# Patient Record
Sex: Male | Born: 1980 | Hispanic: Yes | Marital: Single | Smoking: Never smoker
Health system: Southern US, Community
[De-identification: ages and names within clinical notes are randomized; demographics above are authoritative.]

## PROBLEM LIST (undated history)

## (undated) DIAGNOSIS — F329 Major depressive disorder, single episode, unspecified: Secondary | ICD-10-CM

## (undated) DIAGNOSIS — F32A Depression, unspecified: Secondary | ICD-10-CM

## (undated) DIAGNOSIS — F909 Attention-deficit hyperactivity disorder, unspecified type: Secondary | ICD-10-CM

## (undated) DIAGNOSIS — E119 Type 2 diabetes mellitus without complications: Secondary | ICD-10-CM

---

## 2012-07-16 ENCOUNTER — Ambulatory Visit (INDEPENDENT_AMBULATORY_CARE_PROVIDER_SITE_OTHER): Payer: BC Managed Care – PPO | Admitting: General Surgery

## 2012-07-16 ENCOUNTER — Telehealth (INDEPENDENT_AMBULATORY_CARE_PROVIDER_SITE_OTHER): Payer: Self-pay | Admitting: General Surgery

## 2012-07-16 ENCOUNTER — Encounter (INDEPENDENT_AMBULATORY_CARE_PROVIDER_SITE_OTHER): Payer: Self-pay | Admitting: General Surgery

## 2012-07-16 VITALS — BP 138/78 | HR 78 | Temp 98.2°F | Resp 18 | Ht 70.0 in | Wt 313.0 lb

## 2012-07-16 DIAGNOSIS — L0501 Pilonidal cyst with abscess: Secondary | ICD-10-CM

## 2012-07-16 NOTE — Patient Instructions (Signed)
You have a pilonidal abscess. It looks like this has been adequately drained,The infection is under control, and no further surgery is needed at this point in time.  Continue to take the antibiotics until they are all gone.  Take warm tub baths 3 times a day.  [facility]  repack this every 2 or 3 days.  Return to see Dr. Derrell Lolling in one month to decide whether you need any further surgery.

## 2012-07-16 NOTE — Progress Notes (Signed)
Patient ID: Adrian Nelson, male   DOB: 15-Aug-1980, 32 y.o.   MRN: [id number] History: This gentleman was referred by Dr. Gaynelle Cage at [facility] for evaluation of a pilonidal abscess. This  patient underwent incision and drainage of a pilonidal abscess the [facility] 48 hours ago. He says the pain and pressure were markedly improved and it continues to drain. Ut drained a lot the first 36 hours but this seems to be less now. He has talked with family members and is concerned that he needs a definitive operation. He is taking Keflex.  Exam: Pleasant patient. In no distress. Weight 313. Height 5 feet 10 Pilonidal  area shows an open wound, 8 mm diameter with iodoform gauze present. Packing removed. The wound was probed it does go about 2 cm deep and cephalad. There is no cellulitis or evidence of undrained pus. There is no skin necrosis. This was repacked. Tolerated well  Assessment: Pilonidal abscess, first event. This appears to be adequately drained and the infection appears to be under good control There is no indication for surgical intervention today  Plan: Warm tub baths 3 times daily Continue antibiotics until they're gone Have the physicians at [facility]. Repacked the wound every 2 or 3 days Return to see me in one month to see if any further surgical intervention is indicated.   Angelia Mould. Derrell Lolling, M.D., [facility] General and Minimally invasive Surgery Breast and Colorectal Surgery Office:   [phone] Pager:   [phone]

## 2012-07-16 NOTE — Telephone Encounter (Signed)
Shantee from [facility] [employer] called to request patient be seen for infected pilonidal cyst/ It was partially drained on Sunday-07-14-12/ Appointment made with Dr. Edythe Lynn today/gy/ Shantee will fax records/ Ph- [phone]/gy

## 2012-08-20 ENCOUNTER — Encounter (INDEPENDENT_AMBULATORY_CARE_PROVIDER_SITE_OTHER): Payer: BC Managed Care – PPO | Admitting: General Surgery

## 2012-09-03 ENCOUNTER — Encounter (INDEPENDENT_AMBULATORY_CARE_PROVIDER_SITE_OTHER): Payer: Self-pay | Admitting: General Surgery

## 2015-03-18 ENCOUNTER — Other Ambulatory Visit: Payer: Self-pay | Admitting: Family Medicine

## 2015-03-18 DIAGNOSIS — M2352 Chronic instability of knee, left knee: Secondary | ICD-10-CM

## 2015-04-02 ENCOUNTER — Other Ambulatory Visit: Payer: Self-pay

## 2015-04-06 ENCOUNTER — Ambulatory Visit
Admission: RE | Admit: 2015-04-06 | Discharge: 2015-04-06 | Disposition: A | Discharge status: Home / Self Care | Payer: BLUE CROSS/BLUE SHIELD | Source: Ambulatory Visit | Attending: Family Medicine | Admitting: Family Medicine

## 2015-04-06 DIAGNOSIS — M2352 Chronic instability of knee, left knee: Secondary | ICD-10-CM

## 2018-05-06 ENCOUNTER — Emergency Department (HOSPITAL_COMMUNITY)
Admission: EM | Admit: 2018-05-06 | Discharge: 2018-05-06 | Disposition: A | Discharge status: Home / Self Care | Payer: BLUE CROSS/BLUE SHIELD | Attending: Emergency Medicine | Admitting: Emergency Medicine

## 2018-05-06 ENCOUNTER — Encounter (HOSPITAL_COMMUNITY): Payer: Self-pay | Admitting: Emergency Medicine

## 2018-05-06 ENCOUNTER — Other Ambulatory Visit: Payer: Self-pay

## 2018-05-06 ENCOUNTER — Emergency Department (HOSPITAL_COMMUNITY): Payer: BLUE CROSS/BLUE SHIELD

## 2018-05-06 DIAGNOSIS — N132 Hydronephrosis with renal and ureteral calculous obstruction: Secondary | ICD-10-CM | POA: Insufficient documentation

## 2018-05-06 DIAGNOSIS — R110 Nausea: Secondary | ICD-10-CM | POA: Diagnosis not present

## 2018-05-06 DIAGNOSIS — E119 Type 2 diabetes mellitus without complications: Secondary | ICD-10-CM | POA: Insufficient documentation

## 2018-05-06 DIAGNOSIS — R1031 Right lower quadrant pain: Secondary | ICD-10-CM | POA: Diagnosis present

## 2018-05-06 DIAGNOSIS — R509 Fever, unspecified: Secondary | ICD-10-CM | POA: Diagnosis not present

## 2018-05-06 HISTORY — DX: Attention-deficit hyperactivity disorder, unspecified type: F90.9

## 2018-05-06 HISTORY — DX: Depression, unspecified: F32.A

## 2018-05-06 HISTORY — DX: Type 2 diabetes mellitus without complications: E11.9

## 2018-05-06 HISTORY — DX: Major depressive disorder, single episode, unspecified: F32.9

## 2018-05-06 LAB — COMPREHENSIVE METABOLIC PANEL
ALBUMIN: 3.9 g/dL (ref 3.5–5.0)
ALK PHOS: 79 U/L (ref 38–126)
ALT: 54 U/L — ABNORMAL HIGH (ref 0–44)
AST: 34 U/L (ref 15–41)
Anion gap: 8 (ref 5–15)
BILIRUBIN TOTAL: 0.7 mg/dL (ref 0.3–1.2)
BUN: 17 mg/dL (ref 6–20)
CO2: 27 mmol/L (ref 22–32)
Calcium: 9.3 mg/dL (ref 8.9–10.3)
Chloride: 106 mmol/L (ref 98–111)
Creatinine, Ser: 0.8 mg/dL (ref 0.61–1.24)
GFR calc Af Amer: >60 mL/min (ref >60)
GFR calc non Af Amer: >60 mL/min (ref >60)
GLUCOSE: 95 mg/dL (ref 70–99)
POTASSIUM: 4 mmol/L (ref 3.5–5.1)
Sodium: 141 mmol/L (ref 135–145)
TOTAL PROTEIN: 7.9 g/dL (ref 6.5–8.1)

## 2018-05-06 LAB — URINALYSIS, ROUTINE W REFLEX MICROSCOPIC
BACTERIA UA: NONE SEEN
Bilirubin Urine: NEGATIVE
GLUCOSE, UA: NEGATIVE mg/dL
Ketones, ur: NEGATIVE mg/dL
Leukocytes, UA: NEGATIVE
Nitrite: NEGATIVE
PROTEIN: NEGATIVE mg/dL
Specific Gravity, Urine: 1.039 — ABNORMAL HIGH (ref 1.005–1.030)
pH: 5 (ref 5.0–8.0)

## 2018-05-06 LAB — CBC
HCT: 42.7 % (ref 39.0–52.0)
HEMOGLOBIN: 13.7 g/dL (ref 13.0–17.0)
MCH: 29.2 pg (ref 26.0–34.0)
MCHC: 32.1 g/dL (ref 30.0–36.0)
MCV: 91 fL (ref 80.0–100.0)
Platelets: 229 10*3/uL (ref 150–400)
RBC: 4.69 MIL/uL (ref 4.22–5.81)
RDW: 13.4 % (ref 11.5–15.5)
WBC: 7.6 10*3/uL (ref 4.0–10.5)
nRBC: 0 % (ref 0.0–0.2)

## 2018-05-06 LAB — LIPASE, BLOOD: Lipase: 29 U/L (ref 11–51)

## 2018-05-06 MED ORDER — ONDANSETRON 4 MG PO TBDP: ORAL_TABLET | ORAL | 0 refills | Status: AC

## 2018-05-06 MED ORDER — IOPAMIDOL (ISOVUE-300) INJECTION 61%
INTRAVENOUS | Status: AC
  Filled 2018-05-06: qty 100

## 2018-05-06 MED ORDER — KETOROLAC TROMETHAMINE 30 MG/ML IJ SOLN
30.0000 mg | Freq: Once | INTRAMUSCULAR | Status: AC
  Administered 2018-05-06: 30 mg via INTRAVENOUS
  Filled 2018-05-06: qty 1

## 2018-05-06 MED ORDER — TAMSULOSIN HCL 0.4 MG PO CAPS: 0.4000 mg | ORAL_CAPSULE | Freq: Every day | ORAL | 0 refills | Status: AC

## 2018-05-06 MED ORDER — OXYCODONE-ACETAMINOPHEN 5-325 MG PO TABS: 1.0000 | ORAL_TABLET | Freq: Four times a day (QID) | ORAL | 0 refills | Status: AC | PRN

## 2018-05-06 MED ORDER — SODIUM CHLORIDE (PF) 0.9 % IJ SOLN
INTRAMUSCULAR | Status: AC
  Administered 2018-05-06 (×2)
  Filled 2018-05-06: qty 50

## 2018-05-06 MED ORDER — ONDANSETRON HCL 4 MG/2ML IJ SOLN
4.0000 mg | Freq: Once | INTRAMUSCULAR | Status: AC
  Administered 2018-05-06: 4 mg via INTRAVENOUS
  Filled 2018-05-06: qty 2

## 2018-05-06 MED ORDER — MORPHINE SULFATE (PF) 4 MG/ML IV SOLN
4.0000 mg | Freq: Once | INTRAVENOUS | Status: AC
  Administered 2018-05-06: 4 mg via INTRAVENOUS
  Filled 2018-05-06: qty 1

## 2018-05-06 MED ORDER — SODIUM CHLORIDE 0.9 % IV BOLUS
1000.0000 mL | Freq: Once | INTRAVENOUS | Status: AC
  Administered 2018-05-06: 1000 mL via INTRAVENOUS

## 2018-05-06 MED ORDER — IOPAMIDOL (ISOVUE-300) INJECTION 61%
100.0000 mL | Freq: Once | INTRAVENOUS | Status: AC | PRN
  Administered 2018-05-06: 100 mL via INTRAVENOUS

## 2018-05-06 NOTE — ED Notes (Addendum)
Patient refusing blood draw until seen by EDP. Reports he had blood drawn at PCP today. Results not shown in epic.

## 2018-05-06 NOTE — ED Notes (Signed)
NT mentioned that urine sample was needed, pt directed NT to look [facility] stating that he had already had a urinalysis completed.

## 2018-05-06 NOTE — ED Triage Notes (Addendum)
Patient c/o RLQ pain with nausea that started as dull pain yesterday, increasing to sharp pain today. Reports he was seen at PCP and sent for further evaluation. States urinalysis showed blood in urine.

## 2018-05-06 NOTE — Discharge Instructions (Signed)
You have a kidney stone stuck in the ureter on the right side that is causing your symptoms.  Your lab work and scan looks good otherwise.  Please use pain medication, nausea medicine and Flomax as discussed and strain your urine to help you know when you pass the stone.  This can take a few days.  Please read the information provided.  You can follow-up with urology, return to the emergency department if you have worsening pain not controlled with medications at home, severe vomiting, fevers, are not able to urinate or any other new or concerning symptoms.

## 2018-05-06 NOTE — ED Notes (Signed)
NT explained that blood draw was needed, pt stated that he was a hard stick and that blood draw was already attempted earlier and was unsuccessful. Pt also states that he did not mention that he was diabetic in triage, and wanted NT to note it in chart.

## 2018-05-06 NOTE — ED Notes (Signed)
Pt states that pain in LRQ is intensifying. Pain went from "3 to 3.5, almost a 4 at this point."

## 2018-05-06 NOTE — ED Provider Notes (Signed)
[facility]-EMERGENCY DEPT Provider Note   CSN: [id number] Arrival date & time: 05/06/18  1514     History   Chief Complaint Chief Complaint  Patient presents with  . Abdominal Pain    HPI JAIMES ECKERT is a 37 y.o. male.  SAMMY CASSAR is a 37 y.o. Male with a history of diabetes, depression and ADHD, who presents to the emergency department for evaluation of right lower quadrant abdominal pain.  Patient reports pain started yesterday afternoon and was initially a dull ache, but yesterday it suddenly became sharp and much more severe, this episode seemed to slightly improved, but pain has remained there is a dull ache and intermittently become sharp.  He reports some associated nausea, no vomiting.  He has had a low-grade fever yesterday of 99.8.  Denies any diarrhea or blood in the stools.  No burning or discomfort with urination.  He reports when symptoms continue today he did go to [facility] health at [facility] where they did some blood work and a urinalysis, and they noticed a small amount of blood in the urine but no other acute abnormalities and recommended to present to the ED for further evaluation.  No history of previous abdominal surgeries or issues, no history of kidney stones.  He has not taken anything to treat his symptoms today prior to arrival, denies any other aggravating or alleviating factors.     Past Medical History:  Diagnosis Date  . ADHD   . Depression   . Diabetes mellitus without complication (HCC)     There are no active problems to display for this patient.   History reviewed. No pertinent surgical history.      Home Medications    Prior to Admission medications   Medication Sig Start Date End Date Taking? Authorizing Provider  cephALEXin (KEFLEX) 500 MG capsule Take 500 mg by mouth 2 (two) times daily.    [provider]  traMADol (ULTRAM) 50 MG tablet Take 50 mg by mouth every 6 (six) hours as  needed.    [provider]    Family History No family history on file.  Social History Social History   Tobacco Use  . Smoking status: Never Smoker  Substance Use Topics  . Alcohol use: Not on file  . Drug use: Not on file     Allergies   Patient has no known allergies.   Review of Systems Review of Systems  Constitutional: Negative for chills and fever.  HENT: Negative.   Eyes: Negative for visual disturbance.  Respiratory: Negative for cough and shortness of breath.   Cardiovascular: Negative for chest pain.  Gastrointestinal: Positive for abdominal pain and nausea. Negative for blood in stool, constipation, diarrhea and vomiting.  Genitourinary: Negative for dysuria, flank pain, frequency and hematuria.  Musculoskeletal: Negative for arthralgias and myalgias.  Skin: Negative for color change and rash.  Neurological: Negative for dizziness, syncope and light-headedness.     Physical Exam Updated Vital Signs BP (!) 151/108 (BP Location: Left Arm)   Pulse 89   Temp 97.9 F (36.6 C) (Oral)   Resp 18   SpO2 96%   Physical Exam  Constitutional: He appears well-developed and well-nourished. He does not appear ill. No distress.  HENT:  Head: Normocephalic and atraumatic.  Mouth/Throat: Oropharynx is clear and moist.  Eyes: Right eye exhibits no discharge. Left eye exhibits no discharge.  Cardiovascular: Normal rate, regular rhythm, normal heart sounds and intact distal pulses. Exam reveals no  gallop and no friction rub.  No murmur heard. Pulmonary/Chest: Effort normal and breath sounds normal. No respiratory distress.  Respirations equal and unlabored, patient able to speak in full sentences, lungs clear to auscultation bilaterally  Abdominal: Soft. Normal appearance and bowel sounds are normal. He exhibits no distension. There is tenderness in the right lower quadrant. There is guarding and tenderness at McBurney's point.  Abdomen is soft, nondistended,  bowel sounds present throughout, there is focal tenderness in the right lower quadrant with some guarding and tenderness at McBurney's point, mild right upper quadrant tenderness with negative Murphy sign, there is some mild tenderness across the low back but no focal CVA tenderness bilaterally.  Neurological: He is alert. Coordination normal.  Skin: Skin is warm and dry. Capillary refill takes less than 2 seconds. He is not diaphoretic.  Psychiatric: He has a normal mood and affect. His behavior is normal.  Nursing note and vitals reviewed.    ED Treatments / Results  Labs (all labs ordered are listed, but only abnormal results are displayed) Labs Reviewed  COMPREHENSIVE METABOLIC PANEL - Abnormal; Notable for the following components:      Result Value   ALT 54 (*)    All other components within normal limits  URINALYSIS, ROUTINE W REFLEX MICROSCOPIC - Abnormal; Notable for the following components:   Specific Gravity, Urine 1.039 (*)    Hgb urine dipstick MODERATE (*)    All other components within normal limits  LIPASE, BLOOD  CBC    EKG None  Radiology Ct Abdomen Pelvis W Contrast  Result Date: 05/06/2018 CLINICAL DATA:  Right lower quadrant pain starting yesterday. EXAM: CT ABDOMEN AND PELVIS WITH CONTRAST TECHNIQUE: Multidetector CT imaging of the abdomen and pelvis was performed using the standard protocol following bolus administration of intravenous contrast. CONTRAST:  ISOVUE-300 IOPAMIDOL (ISOVUE-300) INJECTION 61% COMPARISON:  None. FINDINGS: Lower chest: The right lung base is incompletely the diluted. 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 This also limits assessment of the right hepatic dome. Included heart size is normal. No pericardial effusion is seen. Hepatobiliary: Steatosis of the liver. Physiologic distention of the gallbladder without mural thickening, pericholecystic fluid or calculus. No biliary dilatation. No hepatic mass. Pancreas:  Normal Spleen: Normal Adrenals/Urinary Tract: Normal bilateral adrenal glands. Tiny too small to characterize interpolar left renal cyst. Mild right-sided hydronephrosis secondary to a 2 mm calculus in the right UPJ. Stomach/Bowel: Stomach is within normal limits. Appendix appears normal. No evidence of bowel wall thickening, distention, or inflammatory changes. Vascular/Lymphatic: No significant vascular findings are present. No enlarged abdominal or pelvic lymph nodes. Reproductive: Prostate is unremarkable. Other: Small fat containing periumbilical hernia. Musculoskeletal: No acute or significant osseous findings. IMPRESSION: 1. 2 mm right UPJ stone causing mild right-sided hydronephrosis. 2. Hepatic steatosis. 3. Small fat containing periumbilical hernia. 4. Normal appendix.  No acute bowel pathology. Electronically Signed   By: Tollie Eth M.D.   On: 05/06/2018 19:30    Procedures Procedures (including critical care time)  Medications Ordered in ED Medications  sodium chloride 0.9 % bolus 1,000 mL (1,000 mLs Intravenous Bolus 05/06/18 1744)  ondansetron (ZOFRAN) injection 4 mg (4 mg Intravenous Given 05/06/18 1745)  morphine 4 MG/ML injection 4 mg (4 mg Intravenous Given 05/06/18 1745)     Initial Impression / Assessment and Plan / ED Course  I have reviewed the triage vital signs and the nursing notes.  Pertinent labs & imaging results that were available during my care of  the patient were reviewed by me and considered in my medical decision making (see chart for details).  Pt has been diagnosed with a Kidney Stone via CT. CT shows very mild hydronephrosis, serum creatine WNL, no leukocytosis and urinalysis without signs of infection, all of the labs unremarkable vitals sign stable and the pt does not have irratractable vomiting.  CT shows no evidence of other acute pathology, normal appendix.  Pain well controlled here in the ED.  Pt will be dc home with pain medications & has been advised  to follow up with PCP/urology.  Return precautions discussed.  Patient expresses understanding and is in agreement with plan.   Final Clinical Impressions(s) / ED Diagnoses   Final diagnoses:  Ureteral stone with hydronephrosis    ED Discharge Orders         Ordered    ondansetron (ZOFRAN ODT) 4 MG disintegrating tablet     05/06/18 2058    tamsulosin (FLOMAX) 0.4 MG CAPS capsule  Daily     05/06/18 2058    oxyCODONE-acetaminophen (PERCOCET) 5-325 MG tablet  Every 6 hours PRN     05/06/18 2058           Dartha LodgeFord, Kelsey N, PA-C 05/06/18 2112    Tilden Fossaees, Elizabeth, MD 05/06/18 2252

## 2018-05-06 NOTE — ED Notes (Signed)
Patient transported to CT 

## 2020-07-17 IMAGING — CT CT ABD-PELV W/ CM
2 series · 16 of 42 positions shown, 19 images · IV contrast (ISOVUE)
Comparison: None.

CLINICAL DATA: Right lower quadrant pain starting yesterday.

EXAM:
CT ABDOMEN AND PELVIS WITH CONTRAST
TECHNIQUE: Multidetector CT imaging of the abdomen and pelvis was performed
using the standard protocol following bolus administration of
intravenous contrast.
CONTRAST:  100mL YQFFZI-A88 IOPAMIDOL (YQFFZI-A88) INJECTION 61%

[Series 2: axial st · axial · 0.89mm/px · z∈[+866,+1330]mm · 13 of 104 slices shown, 16 images]
[im 7/104  soft-tissue]
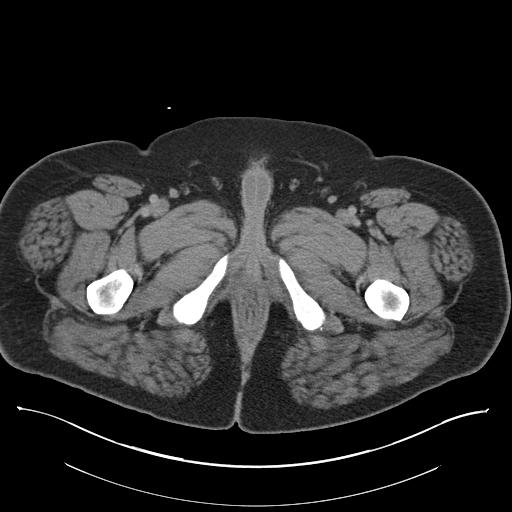
[im 7/104  bone]
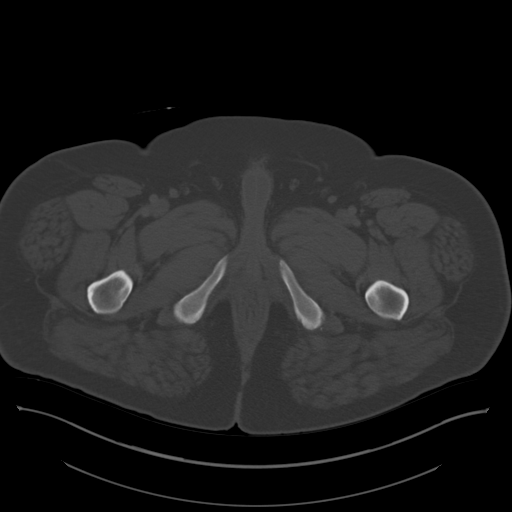
[im 17/104  soft-tissue]
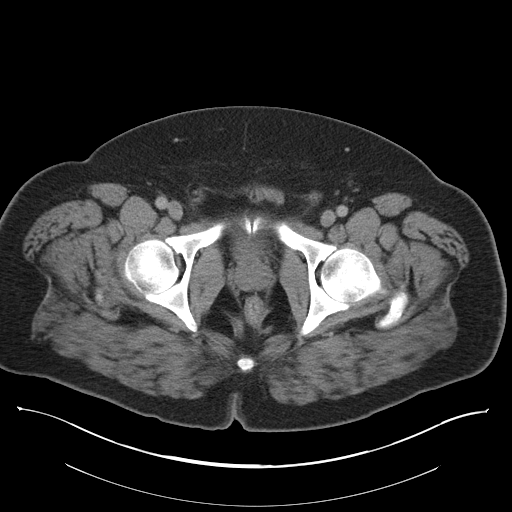
[im 27/104  soft-tissue]
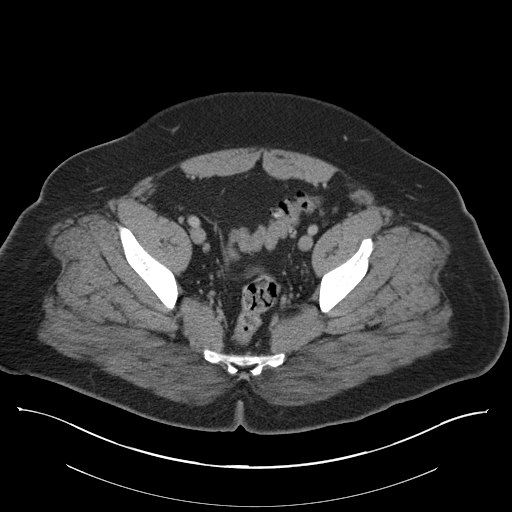
[im 37/104  soft-tissue]
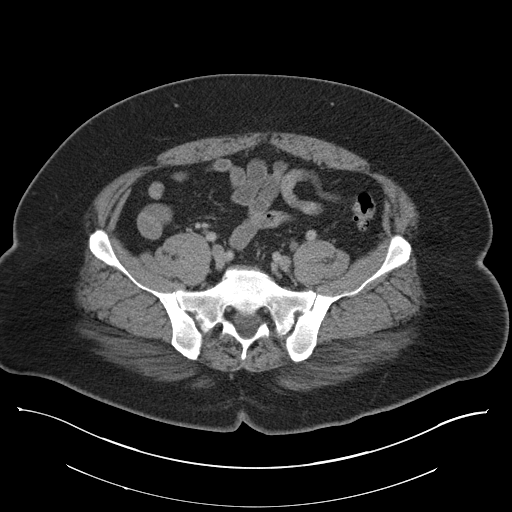
[im 47/104  soft-tissue]
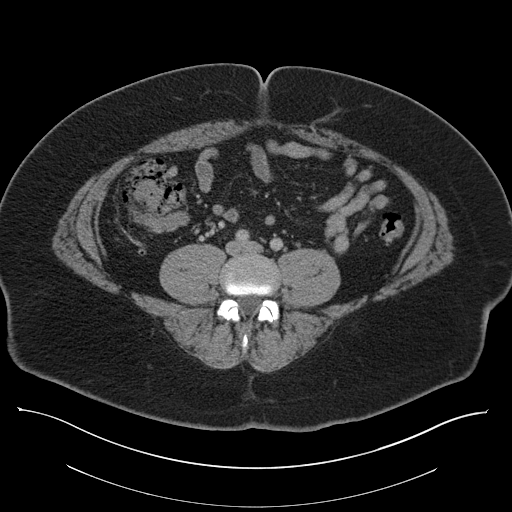
[im 57/104  soft-tissue]
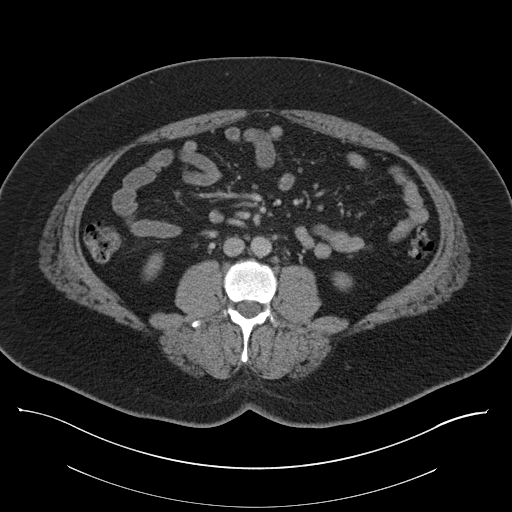
[im 67/104  soft-tissue]
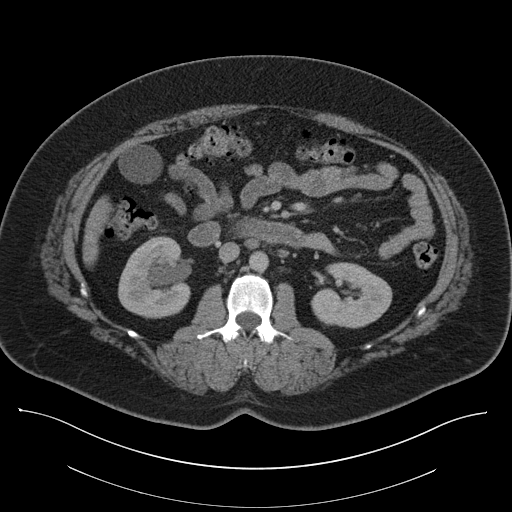
[im 77/104  soft-tissue]
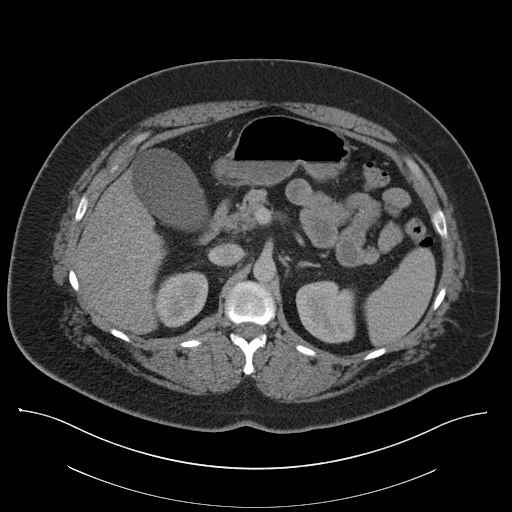
[im 87/104  soft-tissue]
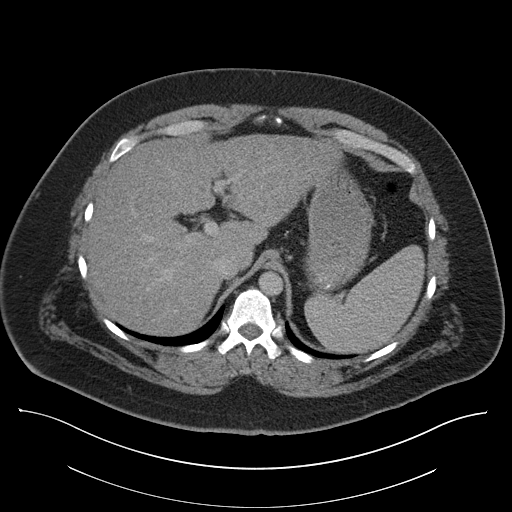
[im 87/104  bone]
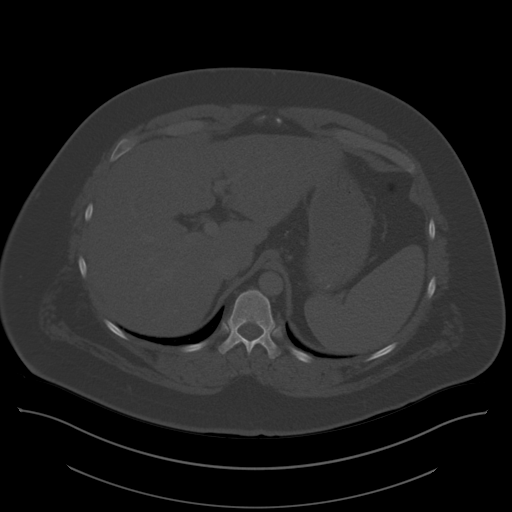
[im 90/104  lung]
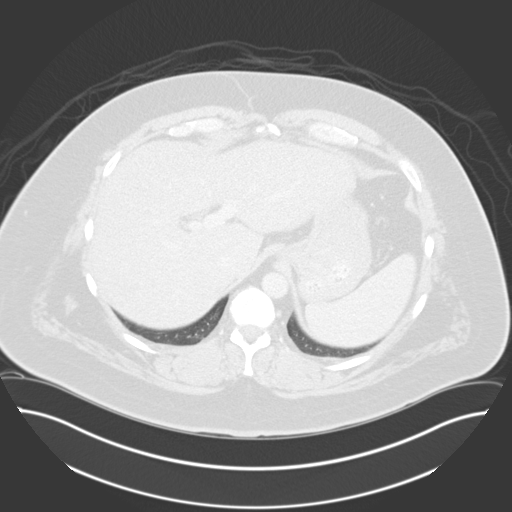
[im 94/104  lung]
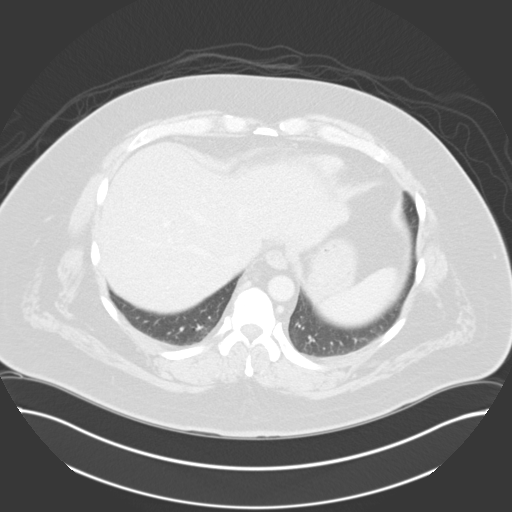
[im 97/104  soft-tissue]
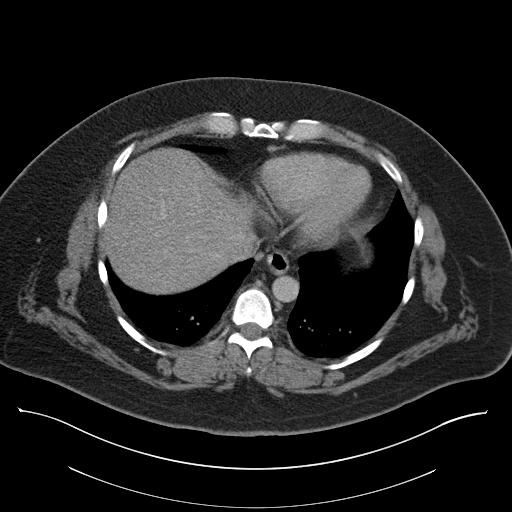
[im 97/104  lung]
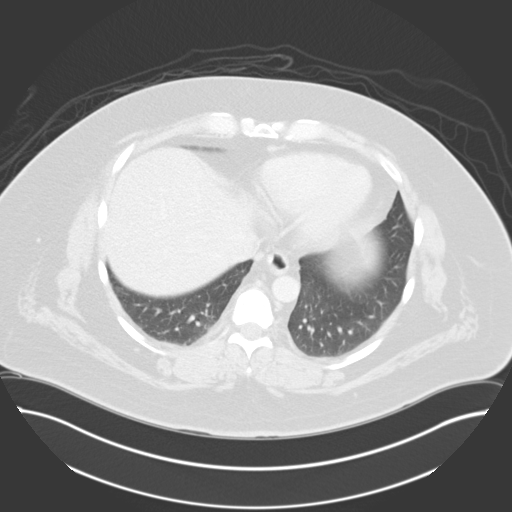
[im 100/104  lung]
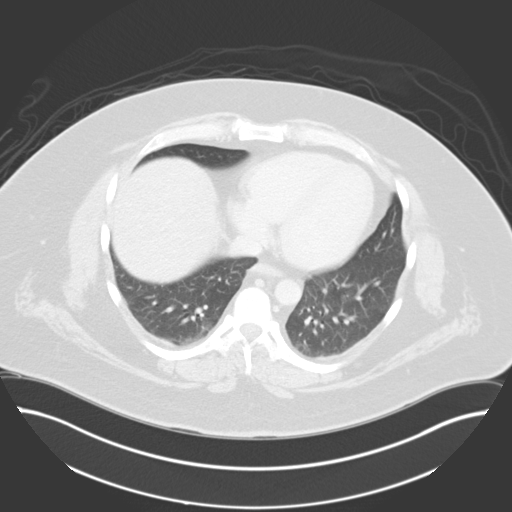

[Series 4: coronal st · coronal · 0.98mm/px · 3 of 109 slices shown]
[im 37/109  soft-tissue]
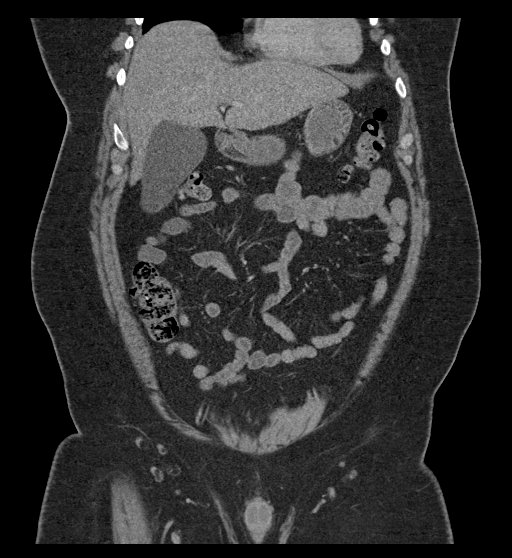
[im 49/109  soft-tissue]
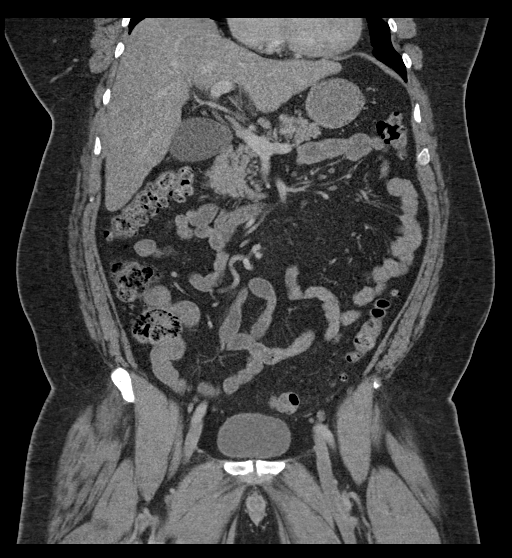
[im 61/109  soft-tissue]
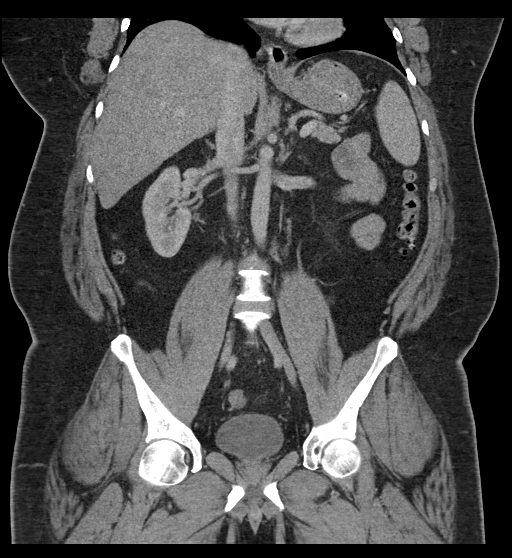

[16 of 42 positions shown; findings below may reference images not displayed]

FINDINGS: Lower chest: The right lung base is incompletely the diluted. This
is likely from eventration of the right hemidiaphragm. This also
limits assessment of the right hepatic dome. Included heart size is
normal. No pericardial effusion is seen.

Hepatobiliary: Steatosis of the liver. Physiologic distention of the
gallbladder without mural thickening, pericholecystic fluid or
calculus. No biliary dilatation. No hepatic mass.

Pancreas: Normal

Spleen: Normal

Adrenals/Urinary Tract: Normal bilateral adrenal glands. Tiny too
small to characterize interpolar left renal cyst. Mild right-sided
hydronephrosis secondary to a 2 mm calculus in the right UPJ.

Stomach/Bowel: Stomach is within normal limits. Appendix appears
normal. No evidence of bowel wall thickening, distention, or
inflammatory changes.

Vascular/Lymphatic: No significant vascular findings are present. No
enlarged abdominal or pelvic lymph nodes.

Reproductive: Prostate is unremarkable.

Other: Small fat containing periumbilical hernia.

Musculoskeletal: No acute or significant osseous findings.
IMPRESSION: 1. 2 mm right UPJ stone causing mild right-sided hydronephrosis.
2. Hepatic steatosis.
3. Small fat containing periumbilical hernia.
4. Normal appendix.  No acute bowel pathology.

## 2023-10-03 ENCOUNTER — Other Ambulatory Visit: Payer: Self-pay | Admitting: Nurse Practitioner

## 2023-10-03 DIAGNOSIS — K760 Fatty (change of) liver, not elsewhere classified: Secondary | ICD-10-CM

## 2023-10-03 DIAGNOSIS — R748 Abnormal levels of other serum enzymes: Secondary | ICD-10-CM

## 2023-10-09 ENCOUNTER — Ambulatory Visit
Admission: RE | Admit: 2023-10-09 | Discharge: 2023-10-09 | Discharge status: Home / Self Care | Source: Ambulatory Visit | Attending: Nurse Practitioner

## 2023-10-09 DIAGNOSIS — K760 Fatty (change of) liver, not elsewhere classified: Secondary | ICD-10-CM

## 2023-10-09 DIAGNOSIS — R748 Abnormal levels of other serum enzymes: Secondary | ICD-10-CM

## 2024-04-04 ENCOUNTER — Other Ambulatory Visit: Payer: Self-pay | Admitting: Nurse Practitioner

## 2024-04-04 DIAGNOSIS — R748 Abnormal levels of other serum enzymes: Secondary | ICD-10-CM

## 2024-04-04 DIAGNOSIS — K7402 Hepatic fibrosis, advanced fibrosis: Secondary | ICD-10-CM

## 2024-04-04 DIAGNOSIS — K760 Fatty (change of) liver, not elsewhere classified: Secondary | ICD-10-CM

## 2024-06-16 ENCOUNTER — Ambulatory Visit
Admission: RE | Admit: 2024-06-16 | Discharge: 2024-06-16 | Disposition: A | Source: Ambulatory Visit | Attending: Nurse Practitioner

## 2024-06-16 DIAGNOSIS — K7402 Hepatic fibrosis, advanced fibrosis: Secondary | ICD-10-CM

## 2024-06-16 DIAGNOSIS — R748 Abnormal levels of other serum enzymes: Secondary | ICD-10-CM

## 2024-06-16 DIAGNOSIS — K760 Fatty (change of) liver, not elsewhere classified: Secondary | ICD-10-CM
